# Patient Record
Sex: Female | Born: 1955
Health system: Southern US, Community
[De-identification: ages and names within clinical notes are randomized; demographics above are authoritative.]

## PROBLEM LIST (undated history)

## (undated) DIAGNOSIS — I10 Essential (primary) hypertension: Secondary | ICD-10-CM

## (undated) DIAGNOSIS — F988 Other specified behavioral and emotional disorders with onset usually occurring in childhood and adolescence: Secondary | ICD-10-CM

## (undated) HISTORY — PX: TONSILLECTOMY: SUR1361

## (undated) HISTORY — PX: ABDOMINAL HYSTERECTOMY: SHX81

## (undated) HISTORY — PX: WISDOM TOOTH EXTRACTION: SHX21

---

## 2007-09-14 HISTORY — PX: OTHER SURGICAL HISTORY: SHX169

## 2008-04-28 ENCOUNTER — Emergency Department (HOSPITAL_BASED_OUTPATIENT_CLINIC_OR_DEPARTMENT_OTHER): Admission: EM | Admit: 2008-04-28 | Discharge: 2008-04-28 | Payer: Self-pay | Admitting: Emergency Medicine

## 2008-04-29 ENCOUNTER — Ambulatory Visit (HOSPITAL_BASED_OUTPATIENT_CLINIC_OR_DEPARTMENT_OTHER): Admission: RE | Admit: 2008-04-29 | Discharge: 2008-04-29 | Payer: Self-pay | Admitting: *Deleted

## 2009-09-13 HISTORY — PX: LEG SURGERY: SHX1003

## 2010-03-31 ENCOUNTER — Emergency Department (HOSPITAL_BASED_OUTPATIENT_CLINIC_OR_DEPARTMENT_OTHER): Admission: EM | Admit: 2010-03-31 | Discharge: 2010-03-31 | Payer: Self-pay | Admitting: Emergency Medicine

## 2010-03-31 ENCOUNTER — Ambulatory Visit: Payer: Self-pay | Admitting: Diagnostic Radiology

## 2010-05-28 ENCOUNTER — Ambulatory Visit: Payer: Self-pay | Admitting: Diagnostic Radiology

## 2010-05-28 ENCOUNTER — Emergency Department (HOSPITAL_BASED_OUTPATIENT_CLINIC_OR_DEPARTMENT_OTHER): Admission: EM | Admit: 2010-05-28 | Discharge: 2010-05-28 | Payer: Self-pay | Admitting: Emergency Medicine

## 2010-05-29 ENCOUNTER — Inpatient Hospital Stay (HOSPITAL_COMMUNITY): Admission: AD | Admit: 2010-05-29 | Discharge: 2010-05-30 | Payer: Self-pay | Admitting: Orthopaedic Surgery

## 2010-09-24 IMAGING — CR DG TIBIA/FIBULA 2V*L*
2 series · 2 of 2 positions shown · non-contrast
Comparison: None

CLINICAL DATA: Left ankle and left lower leg pain status post fall

LEFT TIBIA AND FIBULA - 2 VIEW

[view not recorded (1 of 2)]
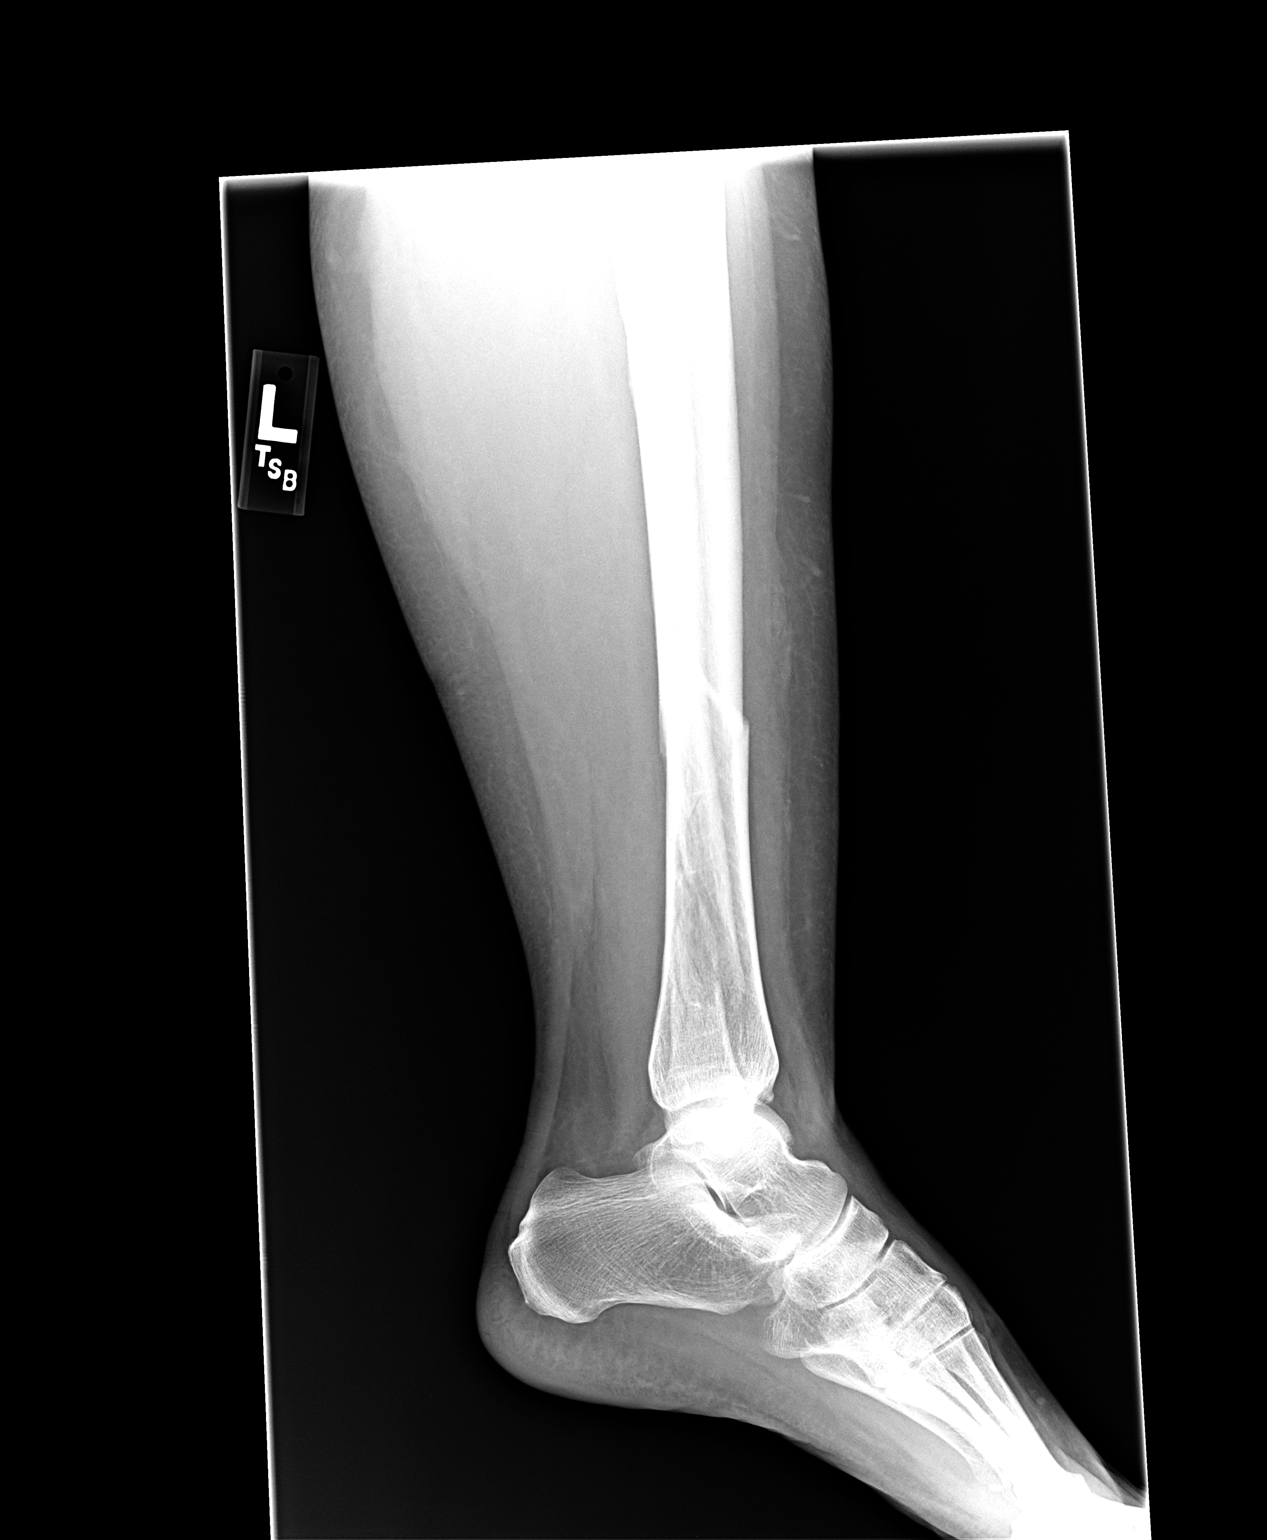

[view not recorded (2 of 2)]
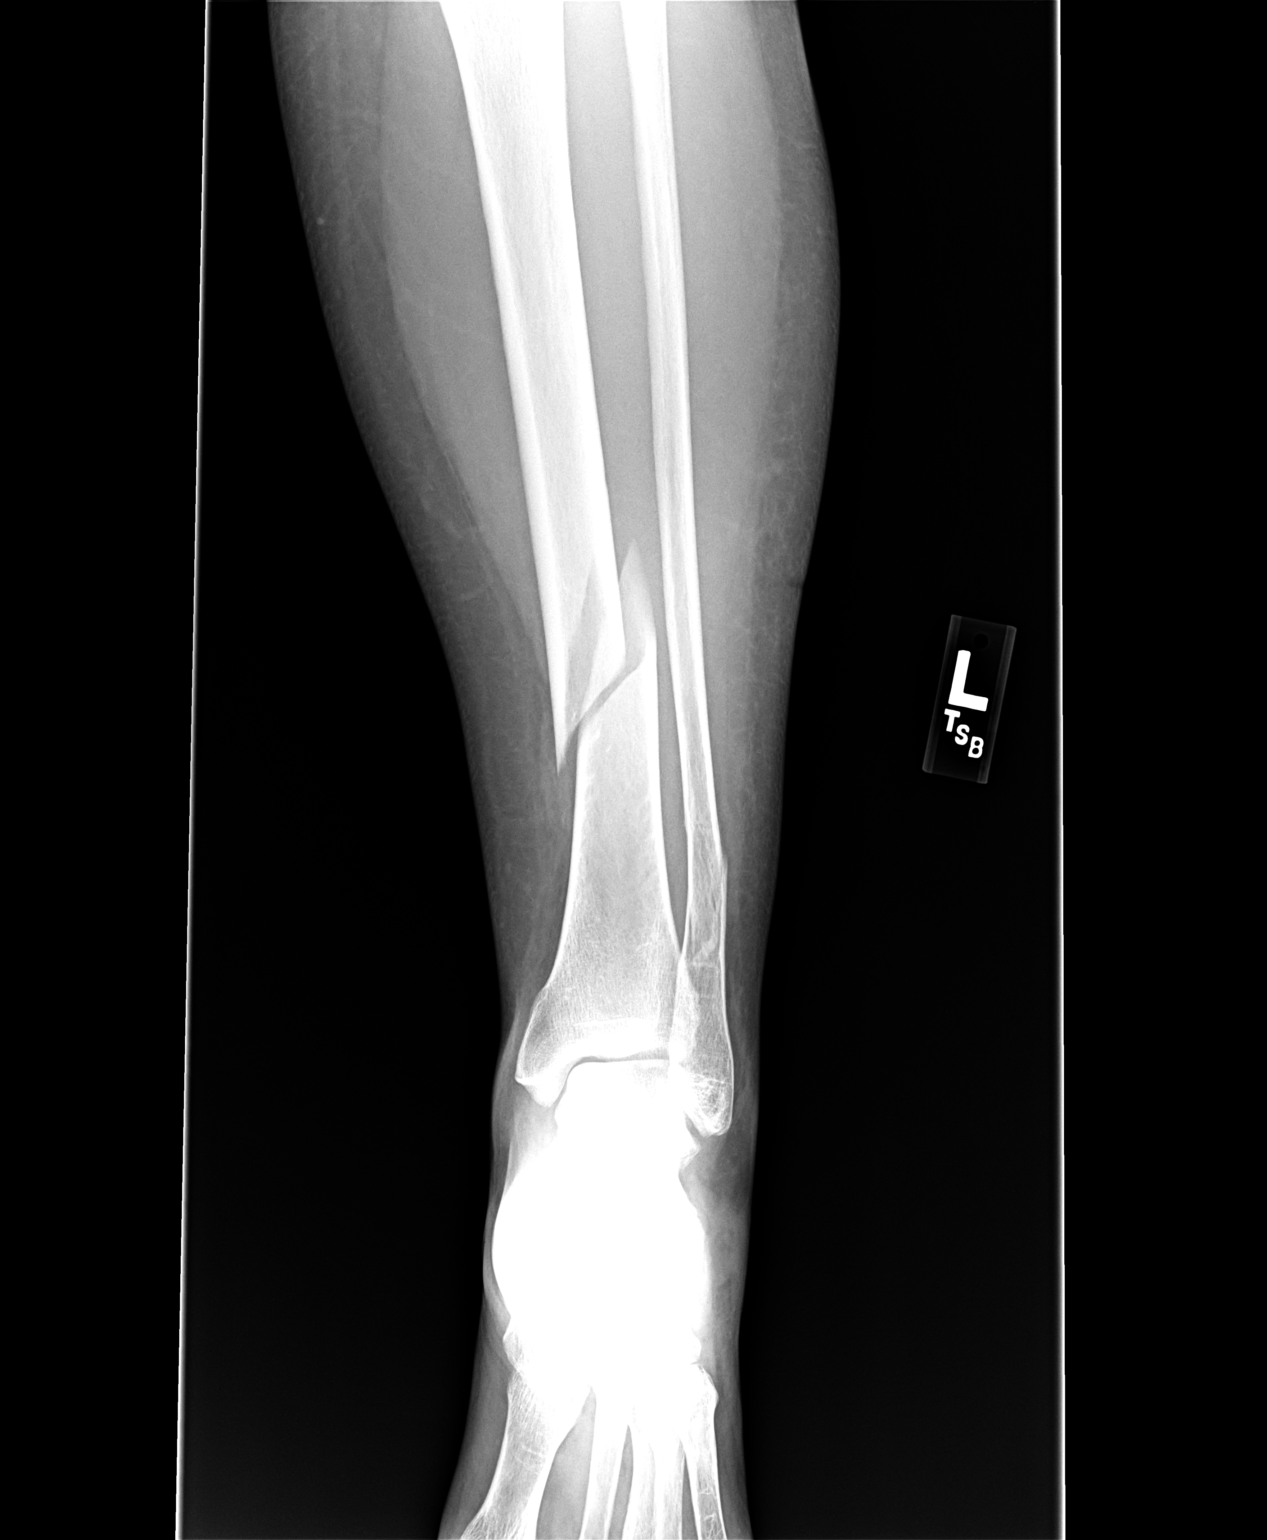

[2 of 2 positions shown; findings below may reference images not displayed]

FINDINGS: Oblique diaphyseal fracture mid to distal left tibia, minimally
displaced anteriorly and laterally.
Additional oblique distal left fibular diaphyseal fracture,
displaced posteriorly with minimal apex lateral angulation.
Associated soft tissue swelling.
Osseous demineralization.
Knee and ankle joint alignments normal.
No additional fracture or dislocation.
IMPRESSION: Left tibial and fibular diaphyseal fractures as above.

## 2010-11-26 LAB — CBC
MCHC: 35.6 g/dL (ref 30.0–36.0)
MCV: 94.5 fL (ref 78.0–100.0)
RBC: 4.12 MIL/uL (ref 3.87–5.11)

## 2010-11-26 LAB — BASIC METABOLIC PANEL
CO2: 27 mEq/L (ref 19–32)
Chloride: 105 mEq/L (ref 96–112)
GFR calc Af Amer: >60 mL/min (ref >60)
GFR calc non Af Amer: >60 mL/min (ref >60)
Glucose, Bld: 115 mg/dL — ABNORMAL HIGH (ref 70–99)
Potassium: 4.1 mEq/L (ref 3.5–5.1)

## 2010-11-26 LAB — SURGICAL PCR SCREEN: Staphylococcus aureus: NEGATIVE

## 2010-11-26 LAB — DIFFERENTIAL
Basophils Absolute: 0 10*3/uL (ref 0.0–0.1)
Basophils Relative: 1 % (ref 0–1)
Eosinophils Absolute: 0.3 10*3/uL (ref 0.0–0.7)
Lymphs Abs: 1.7 10*3/uL (ref 0.7–4.0)

## 2010-11-26 LAB — PROTIME-INR: Prothrombin Time: 12.8 seconds (ref 11.6–15.2)

## 2010-11-26 LAB — APTT: aPTT: 24 seconds (ref 24–37)

## 2010-11-28 LAB — DIFFERENTIAL
Basophils Absolute: 0.2 10*3/uL — ABNORMAL HIGH (ref 0.0–0.1)
Basophils Relative: 2 % — ABNORMAL HIGH (ref 0–1)
Eosinophils Absolute: 0.1 10*3/uL (ref 0.0–0.7)
Eosinophils Relative: 2 % (ref 0–5)
Lymphs Abs: 2.1 10*3/uL (ref 0.7–4.0)
Neutro Abs: 5.8 10*3/uL (ref 1.7–7.7)

## 2010-11-28 LAB — CBC
MCHC: 33.9 g/dL (ref 30.0–36.0)
MCV: 92.4 fL (ref 78.0–100.0)
Platelets: 183 10*3/uL (ref 150–400)
RBC: 4.52 MIL/uL (ref 3.87–5.11)
WBC: 8.6 10*3/uL (ref 4.0–10.5)

## 2010-11-28 LAB — POCT CARDIAC MARKERS
CKMB, poc: 1.3 ng/mL (ref 1.0–8.0)
Myoglobin, poc: 43.6 ng/mL (ref 12–200)
Troponin i, poc: <0.05 ng/mL (ref 0.00–0.09)

## 2010-11-28 LAB — BASIC METABOLIC PANEL
BUN: 11 mg/dL (ref 6–23)
Calcium: 9.8 mg/dL (ref 8.4–10.5)
Chloride: 100 mEq/L (ref 96–112)
GFR calc Af Amer: >60 mL/min (ref >60)
GFR calc non Af Amer: >60 mL/min (ref >60)
Potassium: 4.1 mEq/L (ref 3.5–5.1)
Sodium: 137 mEq/L (ref 135–145)

## 2011-01-26 NOTE — Op Note (Signed)
Chelsea Lang, Chelsea Lang NO.:  [id number]   MEDICAL RECORD NO.:  [id number]          PATIENT TYPE:  AMB   LOCATION:  DSC                          FACILITY:  [facility]   PHYSICIAN:  Tennis Must Meyerdierks, M.D.DATE OF BIRTH:  07-07-1956   DATE OF PROCEDURE:  04/29/2008  DATE OF DISCHARGE:                               OPERATIVE REPORT   PREOPERATIVE DIAGNOSIS:  Fracture, left distal radius.   POSTOPERATIVE DIAGNOSES:  Fracture, left distal radius.   PROCEDURE:  Open reduction and internal fixation, left distal radius  fracture.   SURGEON:  Lowell Bouton, MD   ANESTHESIA:  Axillary block.   OPERATIVE FINDINGS:  The patient had a comminuted fracture of the distal  radius with a tip of the ulnar styloid fracture.   PROCEDURE:  Under axillary block anesthesia with a tourniquet on the  left arm, the left hand was prepped and draped in usual fashion.  After  exsanguinating the limb, the tourniquet was inflated to 250 mmHg.  A  longitudinal incision was made overlying the volar aspect of the FCR  tendon and carried through the subcutaneous tissues.  Bleeding points  were coagulated.  Sharp dissection was carried down to the FCR tendon  sheath, which was released with the scissors.  The tendon was then  retracted ulnarly and the floor of the sheath was incised with the  scissors.  Blunt dissection was carried down to the FPL tendon, which  was retracted ulnarly.  The Weitlaner retractor was inserted and the  pronator quadratus was identified.  It was released from its radial  attachment to the radius.  It was elevated with a Therapist, nutritional.  The  fracture site was then identified and was freed up using a Market researcher.  It was reduced and temporarily pinned with a 45 K-wire  through the radial styloid.  X-rays showed good position of the fracture  and a DVR plate was applied using a short plate.  This was placed on the  volar aspect of the distal radius and  was applied with a 3.5-mm screw in  the slotted hole.  Two smooth pegs were then inserted distally and the  temporary K-wire was removed.  X-rays showed good position.  Four more  pegs were inserted distally using smooth pegs.  The remaining 3.5-mm  screws were then inserted in the radius.  The wound was then copiously  irrigated.  Final x-rays were obtained and there did not appear to be  any penetration of the joint.  TLS drain was inserted for drainage.  The  pronator quadratus was repaired with 4-0 Vicryl.  Subcutaneous tissue  was closed with 4-0 Vicryl and the skin with a 3-0 subcuticular Prolene.  Steri-Strips were applied followed by sterile dressings and a volar  wrist splint.  The tourniquet was released with good circulation of the  hand.  The patient went to recovery room, awake and stable in good  condition.      Lowell Bouton, M.D.  Electronically Signed    EMM/MEDQ  D:  04/29/2008  T:  04/30/2008  Job:  [id number]

## 2013-10-05 ENCOUNTER — Emergency Department (HOSPITAL_BASED_OUTPATIENT_CLINIC_OR_DEPARTMENT_OTHER): Payer: 59

## 2013-10-05 ENCOUNTER — Emergency Department (HOSPITAL_BASED_OUTPATIENT_CLINIC_OR_DEPARTMENT_OTHER)
Admission: EM | Admit: 2013-10-05 | Discharge: 2013-10-05 | Disposition: A | Discharge status: Home / Self Care | Payer: 59 | Attending: Emergency Medicine | Admitting: Emergency Medicine

## 2013-10-05 ENCOUNTER — Encounter (HOSPITAL_BASED_OUTPATIENT_CLINIC_OR_DEPARTMENT_OTHER): Payer: Self-pay | Admitting: Emergency Medicine

## 2013-10-05 DIAGNOSIS — M79609 Pain in unspecified limb: Secondary | ICD-10-CM | POA: Insufficient documentation

## 2013-10-05 DIAGNOSIS — F17200 Nicotine dependence, unspecified, uncomplicated: Secondary | ICD-10-CM | POA: Insufficient documentation

## 2013-10-05 DIAGNOSIS — F988 Other specified behavioral and emotional disorders with onset usually occurring in childhood and adolescence: Secondary | ICD-10-CM | POA: Insufficient documentation

## 2013-10-05 DIAGNOSIS — Z79899 Other long term (current) drug therapy: Secondary | ICD-10-CM | POA: Insufficient documentation

## 2013-10-05 DIAGNOSIS — M79601 Pain in right arm: Secondary | ICD-10-CM

## 2013-10-05 DIAGNOSIS — I10 Essential (primary) hypertension: Secondary | ICD-10-CM | POA: Insufficient documentation

## 2013-10-05 HISTORY — DX: Essential (primary) hypertension: I10

## 2013-10-05 HISTORY — DX: Other specified behavioral and emotional disorders with onset usually occurring in childhood and adolescence: F98.8

## 2013-10-05 LAB — D-DIMER, QUANTITATIVE (NOT AT ARMC): D DIMER QUANT: 0.33 ug{FEU}/mL (ref 0.00–0.48)

## 2013-10-05 MED ORDER — HYDROCODONE-ACETAMINOPHEN 5-325 MG PO TABS: 1.0000 | ORAL_TABLET | Freq: Once | ORAL | Status: DC

## 2013-10-05 MED ORDER — HYDROCODONE-ACETAMINOPHEN 5-325 MG PO TABS: 1.0000 | ORAL_TABLET | Freq: Four times a day (QID) | ORAL | Status: DC | PRN

## 2013-10-05 MED ORDER — KETOROLAC TROMETHAMINE 30 MG/ML IJ SOLN
30.0000 mg | Freq: Once | INTRAMUSCULAR | Status: AC
  Administered 2013-10-05: 30 mg via INTRAMUSCULAR
  Filled 2013-10-05: qty 1

## 2013-10-05 NOTE — ED Notes (Signed)
Right arm pain for over one month.  No known injury.  Was sick with a cough for several weeks when the pain started.  States pain is in the triceps area of the right arm with intermittent radiation of pain into forearm and shoulder.

## 2013-10-05 NOTE — ED Provider Notes (Addendum)
CSN: [id number]     Arrival date & time 10/05/13  4098 History   First MD Initiated Contact with Patient 10/05/13 [phone]     Chief Complaint  Patient presents with  . Arm Pain   (Consider location/radiation/quality/duration/timing/severity/associated sxs/prior Treatment) HPI  This is a 58 year old female who presents with right arm pain. Patient has a recent history of several weeks of viral type symptoms. She's been afebrile. She states since Christmas she has had progressively worsening right arm pain. The pain originates over her deltoid and sometimes goes into her shoulder and down her arm. She reports it as an achy pain. It is worse with some movements. Currently pain is "unbearable." She reports that the pain has been constant with waxing and waning intensity. No association with exertion. She took a Microbiologist prior to coming. She denies any history of blood clots. She denies any injury.  She denies any chest pain or shortness of breath.  Past Medical History  Diagnosis Date  . Hypertension   . ADD (attention deficit disorder)    Past Surgical History  Procedure Laterality Date  . Abdominal hysterectomy     No family history on file. History  Substance Use Topics  . Smoking status: Current Every Day Smoker    Types: Cigarettes  . Smokeless tobacco: Never Used  . Alcohol Use: No   OB History   Grav Para Term Preterm Abortions TAB SAB Ect Mult Living                 Review of Systems  Constitutional: Negative for fever.  Respiratory: Negative for cough, chest tightness and shortness of breath.   Cardiovascular: Negative for chest pain.  Gastrointestinal: Negative for abdominal pain.  Musculoskeletal:       Right arm pain  Skin: Negative for wound.  All other systems reviewed and are negative.    Allergies  Review of patient's allergies indicates no known allergies.  Home Medications   Current Outpatient Rx  Name  Route  Sig  Dispense  Refill  .  amphetamine-dextroamphetamine (ADDERALL XR) 30 MG 24 hr capsule   Oral   Take 30 mg by mouth daily.         Marland Kitchen lisinopril (PRINIVIL,ZESTRIL) 20 MG tablet   Oral   Take 20 mg by mouth daily.         Marland Kitchen HYDROcodone-acetaminophen (NORCO/VICODIN) 5-325 MG per tablet   Oral   Take 1 tablet by mouth every 6 (six) hours as needed for moderate pain.   15 tablet   0    Pulse 97  Temp(Src) 97.9 F (36.6 C) (Oral)  Resp 18  Ht 5\' 3"  (1.6 m)  Wt 150 lb (68.04 kg)  BMI 26.58 kg/m2  SpO2 99% Physical Exam  Nursing note and vitals reviewed. Constitutional: She is oriented to person, place, and time. She appears well-developed and well-nourished.  HENT:  Head: Normocephalic and atraumatic.  Cardiovascular: Normal rate, regular rhythm and normal heart sounds.   No murmur heard. Pulmonary/Chest: Effort normal and breath sounds normal. No respiratory distress. She has no wheezes.  Abdominal: Soft. There is no tenderness.  Musculoskeletal: Normal range of motion. She exhibits no edema.  Normal range of motion of the right upper tremor the, 5 out of 5 strength in the right upper extremity, tenderness palpation over the deltoid was obvious deformity, no significant swelling noted  Neurological: She is alert and oriented to person, place, and time.  Skin: Skin is warm and dry.  Psychiatric: She has a normal mood and affect.    ED Course  Procedures (including critical care time) Labs Review Labs Reviewed  D-DIMER, QUANTITATIVE   Imaging Review Dg Shoulder Right  10/05/2013   CLINICAL DATA:  Posterior right shoulder and humerus pain radiating into the right arm  EXAM: RIGHT SHOULDER - 2+ VIEW  COMPARISON:  None.  FINDINGS: No fracture or dislocation. Glenohumeral and acromioclavicular joint spaces appear preserved given obliquity. No definite evidence of calcific tendinitis. Regional soft tissues are normal. Limited visualization adjacent thorax is normal.  IMPRESSION: No explanation for  patient's right-sided shoulder pain.   Electronically Signed   By: Simonne ComeJohn  Watts M.D.   On: 10/05/2013 08:28    EKG Interpretation   None      EKG independently reviewed by myself: Normal sinus rhythm with a rate of 78, no evidence of acute ST elevation or ischemia  MDM   1. Arm pain, right    Patient presents with right arm pain for 1 month. Pain appears musculoskeletal and she has tenderness to palpation and worsening of pain with range of motion of the right arm. She has good strength and is neurovascularly intact. No known injury. D-dimer is negative to screen for blood clots. X-rays are negative for acute fracture. Screening EKG was obtained and shows no evidence of acute ischemia. Have low suspicion at this time for ACS equivalent given the persistence of pain over the last month and the tenderness on exam. Patient was given Norco and Toradol. She will be given a short course of pain medication as an outpatient. Suspect musculoskeletal etiology. She's to followup with her primary doctor in one week if symptoms persist. She was given strict return precautions.  After history, exam, and medical workup I feel the patient has been appropriately medically screened and is safe for discharge home. Pertinent diagnoses were discussed with the patient. Patient was given return precautions.     Shon Batonourtney F Horton, MD 10/05/13 0900  Patient requesting referral to orthopedics. Patient was given card for [facility] for follow-up.  Shon Batonourtney F Horton, MD 10/05/13 [phone]

## 2013-10-05 NOTE — Discharge Instructions (Signed)
You were seen today for right arm pain. Workup is negative for blood clot, fracture. At this time it is unclear why her arm was hurting. He should followup with her primary doctor in one week if symptoms persist. He'll be given a short course of pain medication. He should return if he has any new or worsening symptoms including chest pain or shortness of breath, fevers or any other concerning symptoms.  The patient has been given a narcotic pain medication at discharge. He/she was instructed to avoid operating heavy machinery or drive while taking this.

## 2014-05-28 ENCOUNTER — Ambulatory Visit (INDEPENDENT_AMBULATORY_CARE_PROVIDER_SITE_OTHER): Payer: 59 | Admitting: Physician Assistant

## 2014-05-28 ENCOUNTER — Encounter: Payer: Self-pay | Admitting: Physician Assistant

## 2014-05-28 VITALS — BP 115/82 | HR 90 | Temp 98.6°F | Resp 16 | Ht 63.0 in | Wt 168.1 lb

## 2014-05-28 DIAGNOSIS — B9689 Other specified bacterial agents as the cause of diseases classified elsewhere: Secondary | ICD-10-CM

## 2014-05-28 DIAGNOSIS — F988 Other specified behavioral and emotional disorders with onset usually occurring in childhood and adolescence: Secondary | ICD-10-CM

## 2014-05-28 DIAGNOSIS — I10 Essential (primary) hypertension: Secondary | ICD-10-CM

## 2014-05-28 DIAGNOSIS — B002 Herpesviral gingivostomatitis and pharyngotonsillitis: Secondary | ICD-10-CM | POA: Insufficient documentation

## 2014-05-28 DIAGNOSIS — J019 Acute sinusitis, unspecified: Secondary | ICD-10-CM

## 2014-05-28 MED ORDER — VALACYCLOVIR HCL 1 G PO TABS: 2000.0000 mg | ORAL_TABLET | Freq: Two times a day (BID) | ORAL | Status: DC

## 2014-05-28 MED ORDER — AZITHROMYCIN 250 MG PO TABS: ORAL_TABLET | ORAL | Status: DC

## 2014-05-28 MED ORDER — BENZONATATE 200 MG PO CAPS: 200.0000 mg | ORAL_CAPSULE | Freq: Three times a day (TID) | ORAL | Status: DC | PRN

## 2014-05-28 MED ORDER — LISINOPRIL 20 MG PO TABS: 20.0000 mg | ORAL_TABLET | Freq: Every day | ORAL | Status: DC

## 2014-05-28 NOTE — Assessment & Plan Note (Signed)
Not responding to Augmentin.  STOP medication.  Rx Azithromycin. Increase fluids.  Rest.  Saline nasal spray. Probiotic.  Rx Tessalon for cough.  Humidifier in bedroom.  Return precautions discussed with patient.

## 2014-05-28 NOTE — Progress Notes (Signed)
Pre visit review using our clinic review tool, if applicable. No additional management support is needed unless otherwise documented below in the visit note/SLS  

## 2014-05-28 NOTE — Assessment & Plan Note (Signed)
Rx Valtrex 2000 mg BID x 1 day.  Topical Abreva.

## 2014-05-28 NOTE — Progress Notes (Signed)
Patient presents to clinic today to establish care.  Acute Concerns: Patient c/o sinus pressure, sinus pain, ear pain and productive cough x 2 weeks. Patient denies fever, chills, aches.  Was seen at Urgent Care on 05/23/14 and given Rx for Augmentin to take twice daily.  Endorses taking as directed without improvement.    Chronic Issues: Hypertension -- Endorses well-controlled with current regimen of lisinopril 20 mg daily. Denies chronic cough.  Denies chest pain, palpitations, headaches, lightheadedness, headaches or vision changes  ADD -- Endorses well-controlled with Adderall XR 30 daily.  Is followed by Psychiatry, Dr. Quentin Mulling who prescribes this.  Health Maintenance: Dental -- up-to-date Vision -- up-to-date Immunizations -- Will be getting flu shot at work on 06/13/2014.  Declines Penumovax. Unsure Tetanus but feels it has been within the past 6-7 years. Colonoscopy -- Overdue for colonoscopy.  Declines at present. Mammogram -- Up-to-date. 2014.  Gets yearly.  No abnormal mammograms. PAP -- Up-to-date. Followed by [facility].  Past Medical History  Diagnosis Date  . Hypertension   . ADD (attention deficit disorder)     Past Surgical History  Procedure Laterality Date  . Abdominal hysterectomy    . Arm surgery  2009    Broken  . Leg surgery  2011    Broken  . Wisdom tooth extraction    . Tonsillectomy      Current Outpatient Prescriptions on File Prior to Visit  Medication Sig Dispense Refill  . amphetamine-dextroamphetamine (ADDERALL XR) 30 MG 24 hr capsule Take 30 mg by mouth daily.       No current facility-administered medications on file prior to visit.    No Known Allergies  Family History  Problem Relation Age of Onset  . Alcoholism Mother 77    Deceased  . Other Father     Deceased- 69-80s  . Stroke Maternal Grandmother   . Dementia Paternal Aunt   . Healthy Son     x1    History   Social History  . Marital Status: Married    Spouse  Name: N/A    Number of Children: N/A  . Years of Education: N/A   Occupational History  . Not on file.   Social History Main Topics  . Smoking status: Current Some Day Smoker -- 0.25 packs/day    Types: Cigarettes  . Smokeless tobacco: Never Used  . Alcohol Use: No  . Drug Use: No  . Sexual Activity: Not on file   Other Topics Concern  . Not on file   Social History Narrative  . No narrative on file   ROS See HPI.  All other ROS are negative.  BP 115/82  Pulse 90  Temp(Src) 98.6 F (37 C) (Oral)  Resp 16  Ht  (1.6 m)  Wt 168 lb 2 oz (76.261 kg)  BMI 29.79 kg/m2  SpO2 95%  Physical Exam  Vitals reviewed. Constitutional: She is well-developed, well-nourished, and in no distress.  HENT:  Head: Normocephalic and atraumatic.  Right Ear: Tympanic membrane, external ear and ear canal normal.  Left Ear: Tympanic membrane, external ear and ear canal normal.  Nose: Mucosal edema present. No rhinorrhea. Right sinus exhibits no maxillary sinus tenderness and no frontal sinus tenderness. Left sinus exhibits maxillary sinus tenderness and frontal sinus tenderness.  Mouth/Throat: Uvula is midline, oropharynx is clear and moist and mucous membranes are normal.  Eyes: Conjunctivae are normal. Pupils are equal, round, and reactive to light.  Neck: Neck supple.  Cardiovascular: Normal  rate, regular rhythm, normal heart sounds and intact distal pulses.   Pulmonary/Chest: Effort normal and breath sounds normal. No respiratory distress. She has no wheezes. She has no rales.  Lymphadenopathy:    She has no cervical adenopathy.  Skin: Skin is warm and dry.  Psychiatric: Affect normal.   Assessment/Plan: Essential hypertension, benign BP well-controlled with current regimen.  Medications refilled.   Acute bacterial sinusitis Not responding to Augmentin.  STOP medication.  Rx Azithromycin. Increase fluids.  Rest.  Saline nasal spray. Probiotic.  Rx Tessalon for cough.  Humidifier  in bedroom.  Return precautions discussed with patient.  Oral herpes simplex infection Rx Valtrex 2000 mg BID x 1 day.  Topical Abreva.   ADD (attention deficit disorder) Doing well.  Continue current regimen.  Follow-up with specialist as scheduled.

## 2014-05-28 NOTE — Assessment & Plan Note (Signed)
BP well-controlled with current regimen.  Medications refilled.

## 2014-05-28 NOTE — Assessment & Plan Note (Signed)
Doing well.  Continue current regimen.  Follow-up with specialist as scheduled.

## 2014-05-28 NOTE — Patient Instructions (Signed)
STOP Augmentin. Please take new antibiotic(Azithromycin) as directed.  Increase fluid intake.  Use Saline nasal spray.  Take a daily multivitamin. Use Tessalon Perles for cough.  Place a humidifier in the bedroom.  Please call or return clinic if symptoms are not improving.  Sinusitis Sinusitis is redness, soreness, and swelling (inflammation) of the paranasal sinuses. Paranasal sinuses are air pockets within the bones of your face (beneath the eyes, the middle of the forehead, or above the eyes). In healthy paranasal sinuses, mucus is able to drain out, and air is able to circulate through them by way of your nose. However, when your paranasal sinuses are inflamed, mucus and air can become trapped. This can allow bacteria and other germs to grow and cause infection. Sinusitis can develop quickly and last only a short time (acute) or continue over a long period (chronic). Sinusitis that lasts for more than 12 weeks is considered chronic.  CAUSES  Causes of sinusitis include:  Allergies.  Structural abnormalities, such as displacement of the cartilage that separates your nostrils (deviated septum), which can decrease the air flow through your nose and sinuses and affect sinus drainage.  Functional abnormalities, such as when the small hairs (cilia) that line your sinuses and help remove mucus do not work properly or are not present. SYMPTOMS  Symptoms of acute and chronic sinusitis are the same. The primary symptoms are pain and pressure around the affected sinuses. Other symptoms include:  Upper toothache.  Earache.  Headache.  Bad breath.  Decreased sense of smell and taste.  A cough, which worsens when you are lying flat.  Fatigue.  Fever.  Thick drainage from your nose, which often is green and may contain pus (purulent).  Swelling and warmth over the affected sinuses. DIAGNOSIS  Your caregiver will perform a physical exam. During the exam, your caregiver may:  Look in your  nose for signs of abnormal growths in your nostrils (nasal polyps).  Tap over the affected sinus to check for signs of infection.  View the inside of your sinuses (endoscopy) with a special imaging device with a light attached (endoscope), which is inserted into your sinuses. If your caregiver suspects that you have chronic sinusitis, one or more of the following tests may be recommended:  Allergy tests.  Nasal culture A sample of mucus is taken from your nose and sent to a lab and screened for bacteria.  Nasal cytology A sample of mucus is taken from your nose and examined by your caregiver to determine if your sinusitis is related to an allergy. TREATMENT  Most cases of acute sinusitis are related to a viral infection and will resolve on their own within 10 days. Sometimes medicines are prescribed to help relieve symptoms (pain medicine, decongestants, nasal steroid sprays, or saline sprays).  However, for sinusitis related to a bacterial infection, your caregiver will prescribe antibiotic medicines. These are medicines that will help kill the bacteria causing the infection.  Rarely, sinusitis is caused by a fungal infection. In theses cases, your caregiver will prescribe antifungal medicine. For some cases of chronic sinusitis, surgery is needed. Generally, these are cases in which sinusitis recurs more than 3 times per year, despite other treatments. HOME CARE INSTRUCTIONS   Drink plenty of water. Water helps thin the mucus so your sinuses can drain more easily.  Use a humidifier.  Inhale steam 3 to 4 times a day (for example, sit in the bathroom with the shower running).  Apply a warm, moist washcloth to  your face 3 to 4 times a day, or as directed by your caregiver.  Use saline nasal sprays to help moisten and clean your sinuses.  Take over-the-counter or prescription medicines for pain, discomfort, or fever only as directed by your caregiver. SEEK IMMEDIATE MEDICAL CARE  IF:  You have increasing pain or severe headaches.  You have nausea, vomiting, or drowsiness.  You have swelling around your face.  You have vision problems.  You have a stiff neck.  You have difficulty breathing. MAKE SURE YOU:   Understand these instructions.  Will watch your condition.  Will get help right away if you are not doing well or get worse. Document Released: 08/30/2005 Document Revised: 11/22/2011 Document Reviewed: 09/14/2011 [facility] Patient Information 2014 [place], [place].

## 2015-05-22 LAB — LIPID PANEL
Cholesterol: 209 mg/dL — AB (ref 0–200)
HDL: 68 mg/dL (ref 35–70)
LDL CALC: 128 mg/dL
Triglycerides: 71 mg/dL (ref 40–160)

## 2015-05-22 LAB — BASIC METABOLIC PANEL: Glucose: 96 mg/dL

## 2015-06-02 ENCOUNTER — Ambulatory Visit (INDEPENDENT_AMBULATORY_CARE_PROVIDER_SITE_OTHER): Payer: 59 | Admitting: Physician Assistant

## 2015-06-02 ENCOUNTER — Encounter: Payer: Self-pay | Admitting: Physician Assistant

## 2015-06-02 VITALS — BP 118/80 | HR 86 | Temp 98.2°F | Resp 16 | Ht 63.0 in | Wt 170.1 lb

## 2015-06-02 DIAGNOSIS — I10 Essential (primary) hypertension: Secondary | ICD-10-CM | POA: Diagnosis not present

## 2015-06-02 DIAGNOSIS — Z23 Encounter for immunization: Secondary | ICD-10-CM

## 2015-06-02 LAB — BASIC METABOLIC PANEL
BUN: 13 mg/dL (ref 6–23)
CHLORIDE: 102 meq/L (ref 96–112)
CO2: 30 mEq/L (ref 19–32)
CREATININE: 0.63 mg/dL (ref 0.40–1.20)
Calcium: 9.8 mg/dL (ref 8.4–10.5)
GFR: 103.59 mL/min (ref >60.00)
GLUCOSE: 111 mg/dL — AB (ref 70–99)
Potassium: 4.3 mEq/L (ref 3.5–5.1)
Sodium: 139 mEq/L (ref 135–145)

## 2015-06-02 MED ORDER — LISINOPRIL 20 MG PO TABS: 20.0000 mg | ORAL_TABLET | Freq: Every day | ORAL | Status: DC

## 2015-06-02 MED ORDER — VALACYCLOVIR HCL 1 G PO TABS: 2000.0000 mg | ORAL_TABLET | Freq: Two times a day (BID) | ORAL | Status: AC | PRN

## 2015-06-02 NOTE — Addendum Note (Signed)
Addended by: Regis Bill on: 06/02/2015 05:40 PM   Modules accepted: Orders

## 2015-06-02 NOTE — Progress Notes (Signed)
   Patient presents to clinic today for follow-up of hypertension, previously well-controlled with lisinopril 20 mg daily. Patient denies chest pain, palpitations, lightheadedness, dizziness, vision changes or frequent headaches. Denies chronic cough with ACEI.  BP Readings from Last 3 Encounters:  06/02/15 118/80  05/28/14 115/82  10/05/13 138/81    Past Medical History  Diagnosis Date  . Hypertension   . ADD (attention deficit disorder)     Current Outpatient Prescriptions on File Prior to Visit  Medication Sig Dispense Refill  . amphetamine-dextroamphetamine (ADDERALL XR) 30 MG 24 hr capsule Take 30 mg by mouth daily.     No current facility-administered medications on file prior to visit.    No Known Allergies  Family History  Problem Relation Age of Onset  . Alcoholism Mother 74    Deceased  . Other Father     Deceased- 70-80s  . Stroke Maternal Grandmother   . Dementia Paternal Aunt   . Healthy Son     x1    Social History   Social History  . Marital Status: Married    Spouse Name: N/A  . Number of Children: N/A  . Years of Education: N/A   Social History Main Topics  . Smoking status: Current Every Day Smoker -- 0.25 packs/day    Types: Cigarettes  . Smokeless tobacco: Never Used  . Alcohol Use: No  . Drug Use: No  . Sexual Activity: Not Asked   Other Topics Concern  . None   Social History Narrative   Review of Systems - See HPI.  All other ROS are negative.  BP 118/80 mmHg  Pulse 86  Temp(Src) 98.2 F (36.8 C) (Oral)  Resp 16  Ht  (1.6 m)  Wt 170 lb 2 oz (77.168 kg)  BMI 30.14 kg/m2  SpO2 98%  Physical Exam  Constitutional: She is oriented to person, place, and time and well-developed, well-nourished, and in no distress.  HENT:  Head: Normocephalic and atraumatic.  Cardiovascular: Normal rate, regular rhythm, normal heart sounds and intact distal pulses.   Pulmonary/Chest: Effort normal and breath sounds normal. No respiratory  distress. She has no wheezes. She has no rales. She exhibits no tenderness.  Neurological: She is alert and oriented to person, place, and time.  Skin: Skin is warm and dry. No rash noted.  Psychiatric: Affect normal.  Vitals reviewed.   No results found for this or any previous visit (from the past 2160 hour(s)).  Assessment/Plan: Essential hypertension, benign Stable. Will check BMP today. Continue current regimen. DASH diet reiterated. Follow-up to be determined by labs.

## 2015-06-02 NOTE — Assessment & Plan Note (Signed)
Stable. Will check BMP today. Continue current regimen. DASH diet reiterated. Follow-up to be determined by labs.

## 2015-06-02 NOTE — Patient Instructions (Signed)
Please go to the lab for blood work.  I will call you with your results.  Please continue your medications as directed. Follow-up will be based on your results.  Once your insurance changes, please reconsider a colonoscopy. Please follow-up with your Gynecologist as scheduled.  DASH Eating Plan DASH stands for "Dietary Approaches to Stop Hypertension." The DASH eating plan is a healthy eating plan that has been shown to reduce high blood pressure (hypertension). Additional health benefits may include reducing the risk of type 2 diabetes mellitus, heart disease, and stroke. The DASH eating plan may also help with weight loss. WHAT DO I NEED TO KNOW ABOUT THE DASH EATING PLAN? For the DASH eating plan, you will follow these general guidelines:  Choose foods with a percent daily value for sodium of less than 5% (as listed on the food label).  Use salt-free seasonings or herbs instead of table salt or sea salt.  Check with your health care provider or pharmacist before using salt substitutes.  Eat lower-sodium products, often labeled as "lower sodium" or "no salt added."  Eat fresh foods.  Eat more vegetables, fruits, and low-fat dairy products.  Choose whole grains. Look for the word "whole" as the first word in the ingredient list.  Choose fish and skinless chicken or [place] more often than red meat. Limit fish, poultry, and meat to 6 oz (170 g) each day.  Limit sweets, desserts, sugars, and sugary drinks.  Choose heart-healthy fats.  Limit cheese to 1 oz (28 g) per day.  Eat more home-cooked food and less restaurant, buffet, and fast food.  Limit fried foods.  Cook foods using methods other than frying.  Limit canned vegetables. If you do use them, rinse them well to decrease the sodium.  When eating at a restaurant, ask that your food be prepared with less salt, or no salt if possible. WHAT FOODS CAN I EAT? Seek help from a dietitian for individual calorie  needs. Grains Whole grain or whole wheat bread. Brown rice. Whole grain or whole wheat pasta. Quinoa, bulgur, and whole grain cereals. Low-sodium cereals. Corn or whole wheat flour tortillas. Whole grain cornbread. Whole grain crackers. Low-sodium crackers. Vegetables Fresh or frozen vegetables (raw, steamed, roasted, or grilled). Low-sodium or reduced-sodium tomato and vegetable juices. Low-sodium or reduced-sodium tomato sauce and paste. Low-sodium or reduced-sodium canned vegetables.  Fruits All fresh, canned (in natural juice), or frozen fruits. Meat and Other Protein Products Ground beef (85% or leaner), grass-fed beef, or beef trimmed of fat. Skinless chicken or [place]. Ground chicken or [place]. Pork trimmed of fat. All fish and seafood. Eggs. Dried beans, peas, or lentils. Unsalted nuts and seeds. Unsalted canned beans. Dairy Low-fat dairy products, such as skim or 1% milk, 2% or reduced-fat cheeses, low-fat ricotta or cottage cheese, or plain low-fat yogurt. Low-sodium or reduced-sodium cheeses. Fats and Oils Tub margarines without trans fats. Light or reduced-fat mayonnaise and salad dressings (reduced sodium). Avocado. Safflower, olive, or canola oils. Natural peanut or almond butter. Other Unsalted popcorn and pretzels. The items listed above may not be a complete list of recommended foods or beverages. Contact your dietitian for more options. WHAT FOODS ARE NOT RECOMMENDED? Grains White bread. White pasta. White rice. Refined cornbread. Bagels and croissants. Crackers that contain trans fat. Vegetables Creamed or fried vegetables. Vegetables in a cheese sauce. Regular canned vegetables. Regular canned tomato sauce and paste. Regular tomato and vegetable juices. Fruits Dried fruits. Canned fruit in light or heavy syrup. Fruit juice. Meat  and Other Protein Products Fatty cuts of meat. Ribs, chicken wings, bacon, sausage, bologna, salami, chitterlings, fatback, hot dogs, bratwurst,  and packaged luncheon meats. Salted nuts and seeds. Canned beans with salt. Dairy Whole or 2% milk, cream, half-and-half, and cream cheese. Whole-fat or sweetened yogurt. Full-fat cheeses or blue cheese. Nondairy creamers and whipped toppings. Processed cheese, cheese spreads, or cheese curds. Condiments Onion and garlic salt, seasoned salt, table salt, and sea salt. Canned and packaged gravies. Worcestershire sauce. Tartar sauce. Barbecue sauce. Teriyaki sauce. Soy sauce, including reduced sodium. Steak sauce. Fish sauce. Oyster sauce. Cocktail sauce. Horseradish. Ketchup and mustard. Meat flavorings and tenderizers. Bouillon cubes. Hot sauce. Tabasco sauce. Marinades. Taco seasonings. Relishes. Fats and Oils Butter, stick margarine, lard, shortening, ghee, and bacon fat. Coconut, palm kernel, or palm oils. Regular salad dressings. Other Pickles and olives. Salted popcorn and pretzels. The items listed above may not be a complete list of foods and beverages to avoid. Contact your dietitian for more information. WHERE CAN I FIND MORE INFORMATION? National Heart, Lung, and Blood Institute: [employer] Document Released: 08/19/2011 Document Revised: 01/14/2014 Document Reviewed: 07/04/2013 [facility] Patient Information 2015 [place], [place]. This information is not intended to replace advice given to you by your health care provider. Make sure you discuss any questions you have with your health care provider.

## 2015-06-02 NOTE — Progress Notes (Signed)
Pre visit review using our clinic review tool, if applicable. No additional management support is needed unless otherwise documented below in the visit note/SLS  

## 2015-06-03 ENCOUNTER — Encounter: Payer: Self-pay | Admitting: *Deleted

## 2015-10-02 MED FILL — LISINOPRIL 20 MG TABLET: 20 | 30 days supply | Qty: 30 | Fill #3

## 2015-10-08 ENCOUNTER — Telehealth: Payer: Self-pay | Admitting: Physician Assistant

## 2015-10-08 NOTE — Telephone Encounter (Signed)
Left message for patient to call about flu shot °

## 2015-10-29 ENCOUNTER — Encounter: Payer: Self-pay | Admitting: Physician Assistant

## 2015-10-29 ENCOUNTER — Ambulatory Visit (INDEPENDENT_AMBULATORY_CARE_PROVIDER_SITE_OTHER): Payer: 59 | Admitting: Physician Assistant

## 2015-10-29 VITALS — BP 140/80 | HR 78 | Temp 97.8°F | Ht 63.0 in | Wt 178.4 lb

## 2015-10-29 DIAGNOSIS — J019 Acute sinusitis, unspecified: Secondary | ICD-10-CM | POA: Diagnosis not present

## 2015-10-29 DIAGNOSIS — B9689 Other specified bacterial agents as the cause of diseases classified elsewhere: Secondary | ICD-10-CM

## 2015-10-29 MED ORDER — AMOXICILLIN-POT CLAVULANATE 875-125 MG PO TABS: 1.0000 | ORAL_TABLET | Freq: Two times a day (BID) | ORAL | Status: DC

## 2015-10-29 MED FILL — LISINOPRIL 20 MG TABLET: 20 | 30 days supply | Qty: 30 | Fill #4

## 2015-10-29 MED FILL — AMOX-CLAV 875-125 MG TABLET: 875-125 | 7 days supply | Qty: 14 | Fill #0

## 2015-10-29 NOTE — Assessment & Plan Note (Signed)
Rx Augmetin.  Increase fluids.  Rest.  Saline nasal spray.  Probiotic.  Mucinex as directed.  Humidifier in bedroom. Delsym for cough.  Call or return to clinic if symptoms are not improving.

## 2015-10-29 NOTE — Progress Notes (Signed)
Pre visit review using our clinic review tool, if applicable. No additional management support is needed unless otherwise documented below in the visit note. 

## 2015-10-29 NOTE — Patient Instructions (Signed)
Please take antibiotic as directed.  Increase fluid intake.  Use Saline nasal spray.  Take a daily multivitamin. Continue saline spray. OTC Delsym for cough  Place a humidifier in the bedroom.  Please call or return clinic if symptoms are not improving.  Sinusitis Sinusitis is redness, soreness, and swelling (inflammation) of the paranasal sinuses. Paranasal sinuses are air pockets within the bones of your face (beneath the eyes, the middle of the forehead, or above the eyes). In healthy paranasal sinuses, mucus is able to drain out, and air is able to circulate through them by way of your nose. However, when your paranasal sinuses are inflamed, mucus and air can become trapped. This can allow bacteria and other germs to grow and cause infection. Sinusitis can develop quickly and last only a short time (acute) or continue over a long period (chronic). Sinusitis that lasts for more than 12 weeks is considered chronic.  CAUSES  Causes of sinusitis include:  Allergies.  Structural abnormalities, such as displacement of the cartilage that separates your nostrils (deviated septum), which can decrease the air flow through your nose and sinuses and affect sinus drainage.  Functional abnormalities, such as when the small hairs (cilia) that line your sinuses and help remove mucus do not work properly or are not present. SYMPTOMS  Symptoms of acute and chronic sinusitis are the same. The primary symptoms are pain and pressure around the affected sinuses. Other symptoms include:  Upper toothache.  Earache.  Headache.  Bad breath.  Decreased sense of smell and taste.  A cough, which worsens when you are lying flat.  Fatigue.  Fever.  Thick drainage from your nose, which often is green and may contain pus (purulent).  Swelling and warmth over the affected sinuses. DIAGNOSIS  Your caregiver will perform a physical exam. During the exam, your caregiver may:  Look in your nose for signs of  abnormal growths in your nostrils (nasal polyps).  Tap over the affected sinus to check for signs of infection.  View the inside of your sinuses (endoscopy) with a special imaging device with a light attached (endoscope), which is inserted into your sinuses. If your caregiver suspects that you have chronic sinusitis, one or more of the following tests may be recommended:  Allergy tests.  Nasal culture A sample of mucus is taken from your nose and sent to a lab and screened for bacteria.  Nasal cytology A sample of mucus is taken from your nose and examined by your caregiver to determine if your sinusitis is related to an allergy. TREATMENT  Most cases of acute sinusitis are related to a viral infection and will resolve on their own within 10 days. Sometimes medicines are prescribed to help relieve symptoms (pain medicine, decongestants, nasal steroid sprays, or saline sprays).  However, for sinusitis related to a bacterial infection, your caregiver will prescribe antibiotic medicines. These are medicines that will help kill the bacteria causing the infection.  Rarely, sinusitis is caused by a fungal infection. In theses cases, your caregiver will prescribe antifungal medicine. For some cases of chronic sinusitis, surgery is needed. Generally, these are cases in which sinusitis recurs more than 3 times per year, despite other treatments. HOME CARE INSTRUCTIONS   Drink plenty of water. Water helps thin the mucus so your sinuses can drain more easily.  Use a humidifier.  Inhale steam 3 to 4 times a day (for example, sit in the bathroom with the shower running).  Apply a warm, moist washcloth to your  face 3 to 4 times a day, or as directed by your caregiver.  Use saline nasal sprays to help moisten and clean your sinuses.  Take over-the-counter or prescription medicines for pain, discomfort, or fever only as directed by your caregiver. SEEK IMMEDIATE MEDICAL CARE IF:  You have increasing  pain or severe headaches.  You have nausea, vomiting, or drowsiness.  You have swelling around your face.  You have vision problems.  You have a stiff neck.  You have difficulty breathing. MAKE SURE YOU:   Understand these instructions.  Will watch your condition.  Will get help right away if you are not doing well or get worse. Document Released: 08/30/2005 Document Revised: 11/22/2011 Document Reviewed: 09/14/2011 [facility] Patient Information 2014 [place], [place].

## 2015-10-29 NOTE — Progress Notes (Signed)
   Patient presents to clinic today c/o 1.5 weeks of nasal congestion with PND and pressure associated with dry irritating cough. Denies facial pain. Endorses ear pressure  And pain. Has felt feverish but has not taken temperature. Denies recent travel or sick contact. Has used a Neti pot with some relief in symptoms. Some tylenol use.   Past Medical History  Diagnosis Date  . Hypertension   . ADD (attention deficit disorder)     Current Outpatient Prescriptions on File Prior to Visit  Medication Sig Dispense Refill  . amphetamine-dextroamphetamine (ADDERALL XR) 30 MG 24 hr capsule Take 4 tablets by mouth daily as needed.    Marland Kitchen lisinopril (PRINIVIL,ZESTRIL) 20 MG tablet Take 1 tablet (20 mg total) by mouth daily. 90 tablet 1  . valACYclovir (VALTREX) 1000 MG tablet Take 2 tablets (2,000 mg total) by mouth 2 (two) times daily as needed. For 1 day. 20 tablet 0   No current facility-administered medications on file prior to visit.    No Known Allergies  Family History  Problem Relation Age of Onset  . Alcoholism Mother 57    Deceased  . Other Father     Deceased- 57-80s  . Stroke Maternal Grandmother   . Dementia Paternal Aunt   . Healthy Son     x1    Social History   Social History  . Marital Status: Married    Spouse Name: N/A  . Number of Children: N/A  . Years of Education: N/A   Social History Main Topics  . Smoking status: Current Every Day Smoker -- 0.25 packs/day    Types: Cigarettes  . Smokeless tobacco: Never Used  . Alcohol Use: No  . Drug Use: No  . Sexual Activity: Not Asked   Other Topics Concern  . None   Social History Narrative   Review of Systems - See HPI.  All other ROS are negative.  BP 140/80 mmHg  Pulse 78  Temp(Src) 97.8 F (36.6 C) (Oral)  Ht  (1.6 m)  Wt 178 lb 6.4 oz (80.922 kg)  BMI 31.61 kg/m2  SpO2 98%  Physical Exam  Constitutional: She is oriented to person, place, and time and well-developed, well-nourished, and in no  distress.  HENT:  Head: Normocephalic and atraumatic.  Right Ear: Tympanic membrane normal.  Left Ear: Tympanic membrane normal.  Nose: Mucosal edema and rhinorrhea present. Right sinus exhibits maxillary sinus tenderness.  Mouth/Throat: Uvula is midline, oropharynx is clear and moist and mucous membranes are normal.  Eyes: Conjunctivae are normal.  Neck: Neck supple.  Cardiovascular: Normal rate, regular rhythm, normal heart sounds and intact distal pulses.   Pulmonary/Chest: Effort normal and breath sounds normal. No respiratory distress. She has no wheezes. She has no rales. She exhibits no tenderness.  Neurological: She is alert and oriented to person, place, and time.  Skin: Skin is warm and dry. No rash noted.  Psychiatric: Affect normal.     Assessment/Plan: Acute bacterial sinusitis Rx Augmetin.  Increase fluids.  Rest.  Saline nasal spray.  Probiotic.  Mucinex as directed.  Humidifier in bedroom. Delsym for cough.  Call or return to clinic if symptoms are not improving.

## 2015-11-28 MED FILL — LISINOPRIL 20 MG TABLET: 20 | 30 days supply | Qty: 30 | Fill #5

## 2015-12-31 ENCOUNTER — Other Ambulatory Visit: Payer: Self-pay | Admitting: Physician Assistant

## 2015-12-31 MED FILL — LISINOPRIL 20 MG TABLET: 20 | 90 days supply | Qty: 90 | Fill #0

## 2015-12-31 NOTE — Telephone Encounter (Signed)
Medication filled to pharmacy as requested.   

## 2016-03-22 MED FILL — LISINOPRIL 20 MG TABLET: 20 | 90 days supply | Qty: 90 | Fill #1

## 2016-04-03 LAB — LIPID PANEL: LDL Cholesterol: 89 mg/dL

## 2016-06-04 LAB — LIPID PANEL
CHOLESTEROL: 174 mg/dL (ref 0–200)
HDL: 71 mg/dL — AB (ref 35–70)
Triglycerides: 71 mg/dL (ref 40–160)

## 2016-06-04 LAB — HM MAMMOGRAPHY: HM Mammogram: NORMAL (ref 0–4)

## 2016-06-22 ENCOUNTER — Encounter: Payer: Self-pay | Admitting: Physician Assistant

## 2016-06-22 ENCOUNTER — Ambulatory Visit (INDEPENDENT_AMBULATORY_CARE_PROVIDER_SITE_OTHER): Payer: 59 | Admitting: Physician Assistant

## 2016-06-22 VITALS — BP 118/78 | HR 75 | Temp 98.2°F | Resp 16 | Ht 63.0 in | Wt 179.1 lb

## 2016-06-22 DIAGNOSIS — I10 Essential (primary) hypertension: Secondary | ICD-10-CM | POA: Diagnosis not present

## 2016-06-22 LAB — BASIC METABOLIC PANEL
BUN: 11 mg/dL (ref 6–23)
CHLORIDE: 103 meq/L (ref 96–112)
CO2: 29 mEq/L (ref 19–32)
Calcium: 9.6 mg/dL (ref 8.4–10.5)
Creatinine, Ser: 0.68 mg/dL (ref 0.40–1.20)
GFR: 94.5 mL/min (ref >60.00)
GLUCOSE: 105 mg/dL — AB (ref 70–99)
POTASSIUM: 3.8 meq/L (ref 3.5–5.1)
Sodium: 138 mEq/L (ref 135–145)

## 2016-06-22 MED ORDER — LISINOPRIL 20 MG PO TABS: 20.0000 mg | ORAL_TABLET | Freq: Every day | ORAL | 1 refills | Status: DC

## 2016-06-22 MED FILL — LISINOPRIL 20 MG TABLET: 20 | 90 days supply | Qty: 90 | Fill #0

## 2016-06-22 NOTE — Progress Notes (Signed)
   Patient presents to clinic today for follow-up of hypertension. Patient currently on regimen of lisinopril 20 mg daily. Has been well controlled on this regimen for some time. Patient endorses taking medication as directed. Patient denies chest pain, palpitations, lightheadedness, dizziness, vision changes or frequent headaches.  BP Readings from Last 3 Encounters:  06/22/16 118/78  10/29/15 140/80  06/02/15 118/80   Past Medical History:  Diagnosis Date  . ADD (attention deficit disorder)   . Hypertension     Current Outpatient Prescriptions on File Prior to Visit  Medication Sig Dispense Refill  . amphetamine-dextroamphetamine (ADDERALL XR) 30 MG 24 hr capsule Take 4 tablets by mouth daily as needed.    . valACYclovir (VALTREX) 1000 MG tablet Take 2 tablets (2,000 mg total) by mouth 2 (two) times daily as needed. For 1 day. 20 tablet 0   No current facility-administered medications on file prior to visit.     No Known Allergies  Family History  Problem Relation Age of Onset  . Alcoholism Mother [id number]    Deceased  . Other Father     Deceased- 5970-80s  . Stroke Maternal Grandmother   . Dementia Paternal Aunt   . Healthy Son     x1    Social History   Social History  . Marital status: Married    Spouse name: N/A  . Number of children: N/A  . Years of education: N/A   Social History Main Topics  . Smoking status: Current Every Day Smoker    Packs/day: 0.25    Types: Cigarettes  . Smokeless tobacco: Never Used  . Alcohol use No  . Drug use: No  . Sexual activity: Not Asked   Other Topics Concern  . None   Social History Narrative  . None   Review of Systems - See HPI.  All other ROS are negative.  BP 118/78 (BP Location: Right Arm, Patient Position: Sitting, Cuff Size: Large)   Pulse 75   Temp 98.2 F (36.8 C) (Oral)   Resp 16   Ht 5\' 3"  (1.6 m)   Wt 179 lb 2 oz (81.3 kg)   SpO2 99%   BMI 31.73 kg/m   Physical Exam  Constitutional: She is oriented  to person, place, and time and well-developed, well-nourished, and in no distress.  HENT:  Head: Normocephalic and atraumatic.  Eyes: Conjunctivae are normal.  Neck: Neck supple.  Cardiovascular: Normal rate, regular rhythm, normal heart sounds and intact distal pulses.   Pulmonary/Chest: Effort normal and breath sounds normal. No respiratory distress. She has no wheezes. She has no rales. She exhibits no tenderness.  Neurological: She is alert and oriented to person, place, and time.  Skin: Skin is warm and dry. No rash noted.  Psychiatric: Affect normal.  Vitals reviewed.  Assessment/Plan: Essential hypertension, benign BP normotensive. Asymptomatic. Will check BMP today. Medications refilled. Recent results from Biometric screen look good. Will abstract into her chart.  Piedad ClimesMartin, Therisa Mennella Cody, PA-C

## 2016-06-22 NOTE — Progress Notes (Signed)
Pre visit review using our clinic review tool, if applicable. No additional management support is needed unless otherwise documented below in the visit note/SLS  

## 2016-06-22 NOTE — Assessment & Plan Note (Signed)
BP normotensive. Asymptomatic. Will check BMP today. Medications refilled. Recent results from Biometric screen look good. Will abstract into her chart.

## 2016-06-22 NOTE — Patient Instructions (Signed)
Please go to the lab for blood work. I will call with your results.  Please continue medications as directed. Check with your insurance regarding the Cologuard testing.   Follow-up in 6 months.  DASH Eating Plan DASH stands for "Dietary Approaches to Stop Hypertension." The DASH eating plan is a healthy eating plan that has been shown to reduce high blood pressure (hypertension). Additional health benefits may include reducing the risk of type 2 diabetes mellitus, heart disease, and stroke. The DASH eating plan may also help with weight loss. WHAT DO I NEED TO KNOW ABOUT THE DASH EATING PLAN? For the DASH eating plan, you will follow these general guidelines:  Choose foods with a percent daily value for sodium of less than 5% (as listed on the food label).  Use salt-free seasonings or herbs instead of table salt or sea salt.  Check with your health care provider or pharmacist before using salt substitutes.  Eat lower-sodium products, often labeled as "lower sodium" or "no salt added."  Eat fresh foods.  Eat more vegetables, fruits, and low-fat dairy products.  Choose whole grains. Look for the word "whole" as the first word in the ingredient list.  Choose fish and skinless chicken or [place] more often than red meat. Limit fish, poultry, and meat to 6 oz (170 g) each day.  Limit sweets, desserts, sugars, and sugary drinks.  Choose heart-healthy fats.  Limit cheese to 1 oz (28 g) per day.  Eat more home-cooked food and less restaurant, buffet, and fast food.  Limit fried foods.  Cook foods using methods other than frying.  Limit canned vegetables. If you do use them, rinse them well to decrease the sodium.  When eating at a restaurant, ask that your food be prepared with less salt, or no salt if possible. WHAT FOODS CAN I EAT? Seek help from a dietitian for individual calorie needs. Grains Whole grain or whole wheat bread. Brown rice. Whole grain or whole wheat pasta.  Quinoa, bulgur, and whole grain cereals. Low-sodium cereals. Corn or whole wheat flour tortillas. Whole grain cornbread. Whole grain crackers. Low-sodium crackers. Vegetables Fresh or frozen vegetables (raw, steamed, roasted, or grilled). Low-sodium or reduced-sodium tomato and vegetable juices. Low-sodium or reduced-sodium tomato sauce and paste. Low-sodium or reduced-sodium canned vegetables.  Fruits All fresh, canned (in natural juice), or frozen fruits. Meat and Other Protein Products Ground beef (85% or leaner), grass-fed beef, or beef trimmed of fat. Skinless chicken or [place]. Ground chicken or [place]. Pork trimmed of fat. All fish and seafood. Eggs. Dried beans, peas, or lentils. Unsalted nuts and seeds. Unsalted canned beans. Dairy Low-fat dairy products, such as skim or 1% milk, 2% or reduced-fat cheeses, low-fat ricotta or cottage cheese, or plain low-fat yogurt. Low-sodium or reduced-sodium cheeses. Fats and Oils Tub margarines without trans fats. Light or reduced-fat mayonnaise and salad dressings (reduced sodium). Avocado. Safflower, olive, or canola oils. Natural peanut or almond butter. Other Unsalted popcorn and pretzels. The items listed above may not be a complete list of recommended foods or beverages. Contact your dietitian for more options. WHAT FOODS ARE NOT RECOMMENDED? Grains White bread. White pasta. White rice. Refined cornbread. Bagels and croissants. Crackers that contain trans fat. Vegetables Creamed or fried vegetables. Vegetables in a cheese sauce. Regular canned vegetables. Regular canned tomato sauce and paste. Regular tomato and vegetable juices. Fruits Dried fruits. Canned fruit in light or heavy syrup. Fruit juice. Meat and Other Protein Products Fatty cuts of meat. Ribs, chicken wings, bacon,  sausage, bologna, salami, chitterlings, fatback, hot dogs, bratwurst, and packaged luncheon meats. Salted nuts and seeds. Canned beans with salt. Dairy Whole or 2%  milk, cream, half-and-half, and cream cheese. Whole-fat or sweetened yogurt. Full-fat cheeses or blue cheese. Nondairy creamers and whipped toppings. Processed cheese, cheese spreads, or cheese curds. Condiments Onion and garlic salt, seasoned salt, table salt, and sea salt. Canned and packaged gravies. Worcestershire sauce. Tartar sauce. Barbecue sauce. Teriyaki sauce. Soy sauce, including reduced sodium. Steak sauce. Fish sauce. Oyster sauce. Cocktail sauce. Horseradish. Ketchup and mustard. Meat flavorings and tenderizers. Bouillon cubes. Hot sauce. Tabasco sauce. Marinades. Taco seasonings. Relishes. Fats and Oils Butter, stick margarine, lard, shortening, ghee, and bacon fat. Coconut, palm kernel, or palm oils. Regular salad dressings. Other Pickles and olives. Salted popcorn and pretzels. The items listed above may not be a complete list of foods and beverages to avoid. Contact your dietitian for more information. WHERE CAN I FIND MORE INFORMATION? National Heart, Lung, and Blood Institute: travelstabloid.com   This information is not intended to replace advice given to you by your health care provider. Make sure you discuss any questions you have with your health care provider.   Document Released: 08/19/2011 Document Revised: 09/20/2014 Document Reviewed: 07/04/2013 Elsevier Interactive Patient Education [employer].

## 2016-08-31 MED FILL — LISINOPRIL 20 MG TABLET: 20 | 90 days supply | Qty: 90 | Fill #1

## 2016-09-15 ENCOUNTER — Ambulatory Visit (INDEPENDENT_AMBULATORY_CARE_PROVIDER_SITE_OTHER): Payer: 59 | Admitting: Family Medicine

## 2016-09-15 ENCOUNTER — Encounter: Payer: Self-pay | Admitting: Family Medicine

## 2016-09-15 VITALS — BP 112/78 | HR 96 | Temp 98.8°F | Ht 63.0 in | Wt 179.8 lb

## 2016-09-15 DIAGNOSIS — B9689 Other specified bacterial agents as the cause of diseases classified elsewhere: Secondary | ICD-10-CM

## 2016-09-15 DIAGNOSIS — J208 Acute bronchitis due to other specified organisms: Secondary | ICD-10-CM

## 2016-09-15 MED ORDER — BENZONATATE 100 MG PO CAPS: 100.0000 mg | ORAL_CAPSULE | Freq: Three times a day (TID) | ORAL | 0 refills | Status: AC | PRN

## 2016-09-15 MED ORDER — AZITHROMYCIN 250 MG PO TABS: ORAL_TABLET | ORAL | 0 refills | Status: DC

## 2016-09-15 MED FILL — BENZONATATE 100 MG CAPSULE: 100 | 6 days supply | Qty: 20 | Fill #0

## 2016-09-15 MED FILL — AZITHROMYCIN 250 MG TABLET: 250 | 5 days supply | Qty: 6 | Fill #0

## 2016-09-15 NOTE — Patient Instructions (Signed)
Claritin (loratadine), Allegra (fexofenadine), Zyrtec (cetirizine); these are listed in order from weakest to strongest. Generic, and therefore cheaper, options are in the parentheses.   Flonase (fluticasone); nasal spray that is over the counter. 2 sprays each nostril, once daily. Aim towards the same side eye when you spray.  There are available OTC, and the generic versions, which may be cheaper, are in parentheses. Show this to a pharmacist if you have trouble finding any of these items.  

## 2016-09-15 NOTE — Progress Notes (Signed)
Pre visit review using our clinic review tool, if applicable. No additional management support is needed unless otherwise documented below in the visit note. 

## 2016-09-15 NOTE — Progress Notes (Signed)
Chief Complaint  Patient presents with  . Nasal Congestion    Pt reports ear pain nasal and chest congestion     Chelsea Lang here for URI complaints.  Duration: 10 days, worsening  Associated symptoms: fevers (up to 101 F) sinus congestion, rhinorrhea, ear pain, sore throat, cough and chest tightness Denies: Itchy, watery eyes, ear drainage/bleeding, sinus pain, myalgias Treatment to date: Mucinex, OTC remedies Sick contacts: Yes  ROS:  Const: + fevers HEENT: As noted in HPI Lungs: No SOB  Past Medical History:  Diagnosis Date  . ADD (attention deficit disorder)   . Hypertension    Family History  Problem Relation Age of Onset  . Alcoholism Mother 349    Deceased  . Other Father     Deceased- 5970-80s  . Stroke Maternal Grandmother   . Dementia Paternal Aunt   . Healthy Son     x1    BP 112/78 (BP Location: Left Arm, Patient Position: Sitting, Cuff Size: Large)   Pulse 96   Temp 98.8 F (37.1 C) (Oral)   Ht 5\' 3"  (1.6 m)   Wt 179 lb 12.8 oz (81.6 kg)   SpO2 91%   BMI 31.85 kg/m  General: Awake, alert, appears stated age HEENT: AT, , ears patent b/l and TM's neg, nares paten + clear discharge, pharynx pink and without exudates, MMM Neck: No masses or asymmetry Heart: RRR, no murmurs, no bruits Lung+transmitted upper airway noises, CTAB otherwise, no accessory muscle use Psych: Age appropriate judgment and insight, normal mood and affect  Acute bacterial bronchitis - Plan: azithromycin (ZITHROMAX) 250 MG tablet, benzonatate (TESSALON) 100 MG capsule  Orders as above. PO antihistamines and Flonase recommended for further symptom control.  Wash hands, cover mouth when coughing, push fluids. F/u in 1 week if symptoms worsen or fail to improve. Pt voiced understanding and agreement to the plan.  Jilda Rocheicholas Paul CarrickWendling, DO 09/15/16 8:39 AM

## 2016-09-22 ENCOUNTER — Encounter: Payer: Self-pay | Admitting: Family Medicine

## 2016-09-22 ENCOUNTER — Ambulatory Visit (INDEPENDENT_AMBULATORY_CARE_PROVIDER_SITE_OTHER): Payer: 59 | Admitting: Family Medicine

## 2016-09-22 VITALS — BP 128/66 | HR 84 | Temp 97.8°F | Wt 179.6 lb

## 2016-09-22 DIAGNOSIS — J029 Acute pharyngitis, unspecified: Secondary | ICD-10-CM | POA: Diagnosis not present

## 2016-09-22 DIAGNOSIS — J0100 Acute maxillary sinusitis, unspecified: Secondary | ICD-10-CM

## 2016-09-22 LAB — POCT RAPID STREP A (OFFICE): RAPID STREP A SCREEN: NEGATIVE

## 2016-09-22 MED ORDER — AMOXICILLIN-POT CLAVULANATE 875-125 MG PO TABS: 1.0000 | ORAL_TABLET | Freq: Two times a day (BID) | ORAL | 0 refills | Status: DC

## 2016-09-22 MED FILL — AMOX-CLAV 875-125 MG TABLET: 875-125 | 10 days supply | Qty: 20 | Fill #0

## 2016-09-22 NOTE — Progress Notes (Signed)
Chief Complaint  Patient presents with  . Sore Throat    Chelsea Lang here for URI complaints.  Duration: 17 days  Associated symptoms: sinus congestion, sinus pain, rhinorrhea, sore throat and cough Denies: subjective fever, sinus headache, ear pain, ear drainage, shortness of breath and myalgia Treatment to date: Zpak, Mucinex, salt water gargles Sick contacts: No  ROS:  Const: Denies fevers HEENT: As noted in HPI Lungs: No SOB  Past Medical History:  Diagnosis Date  . ADD (attention deficit disorder)   . Hypertension    Family History  Problem Relation Age of Onset  . Alcoholism Mother 7649    Deceased  . Other Father     Deceased- 4370-80s  . Stroke Maternal Grandmother   . Dementia Paternal Aunt   . Healthy Son     x1    BP 128/66 (BP Location: Right Arm, Patient Position: Sitting, Cuff Size: Normal)   Pulse 84   Temp 97.8 F (36.6 C) (Oral)   Wt 179 lb 9.6 oz (81.5 kg)   SpO2 100% Comment: RA  BMI 31.81 kg/m  General: Awake, alert, appears stated age HEENT: AT, Chelsea Lang, ears patent b/l and TM's neg, nares patent w/o discharge, max sinsuses TTP b/l, pharynx pink and without exudates, MMM Neck: No masses or asymmetry Heart: RRR, no murmurs, no bruits Lungs: CTAB, no accessory muscle use Psych: Age appropriate judgment and insight, normal mood and affect  Acute non-recurrent maxillary sinusitis - Plan: amoxicillin-clavulanate (AUGMENTIN) 875-125 MG tablet  Sore throat - Plan: POCT rapid strep A  Orders as above. Stay on Zyrtec and Flonase. Continue to push fluids, practice good hand hygiene, cover mouth when coughing. F/u in 1 week if symptoms worsen or fail to improve. Pt voiced understanding and agreement to the plan.  Jilda Rocheicholas Paul [place], DO 09/22/16 11:42 AM

## 2016-09-22 NOTE — Progress Notes (Signed)
Pre visit review using our clinic review tool, if applicable. No additional management support is needed unless otherwise documented below in the visit note. 

## 2016-09-22 NOTE — Patient Instructions (Signed)
Keep using Flonase and Zyrtec.   Keep practicing good hand hygiene, drinking lots of fluids, and covering mouth.  Stop smoking.

## 2016-10-14 ENCOUNTER — Encounter (INDEPENDENT_AMBULATORY_CARE_PROVIDER_SITE_OTHER): Payer: Self-pay | Admitting: Physician Assistant

## 2016-10-14 ENCOUNTER — Ambulatory Visit (INDEPENDENT_AMBULATORY_CARE_PROVIDER_SITE_OTHER): Payer: 59

## 2016-10-14 ENCOUNTER — Ambulatory Visit (INDEPENDENT_AMBULATORY_CARE_PROVIDER_SITE_OTHER): Payer: 59 | Admitting: Physician Assistant

## 2016-10-14 DIAGNOSIS — M25521 Pain in right elbow: Secondary | ICD-10-CM

## 2016-10-14 DIAGNOSIS — M7021 Olecranon bursitis, right elbow: Secondary | ICD-10-CM

## 2016-10-14 NOTE — Progress Notes (Signed)
Office Visit Note   Patient: Hassel NethKim Akerson           Date of Birth: 05/15/1956           MRN: [id number] Visit Date: 10/14/2016              Requested by: Waldon MerlWilliam C Martin, PA-C [address] [place], [place] [address] PCP: Piedad ClimesMartin, William Cody, PA-C   Assessment & Plan: Visit Diagnoses:  1. Pain in right elbow   2. Olecranon bursitis, right elbow     Plan: Back in 2 weeks' check the right elbow. Compression bandage was applied today she'll leave this on until this evening and then remove it. She was reminded to keep pressure off the elbow as much as possible.  Follow-Up Instructions: Return in about 2 weeks (around 10/28/2016).   Orders:  Orders Placed This Encounter  Procedures  . XR Elbow 2 Views Right   No orders of the defined types were placed in this encounter.     Procedures: No procedures performed   Clinical Data: No additional findings.   Subjective: Chief Complaint  Patient presents with  . Right Elbow - Pain    HPI This Wery's known Dr. Eliberto IvoryBlackman's service comes in today with right elbow pain. She reports that she fell on her elbow during the summer. Now she has area of swelling circle on the right elbow. Only hurts when she is applying pressure to the right elbow. Review of Systems   Objective: Vital Signs: There were no vitals taken for this visit.  Physical Exam  Constitutional: She is oriented to person, place, and time. She appears well-developed and well-nourished. No distress.  Neurological: She is alert and oriented to person, place, and time.  Psychiatric: She has a normal mood and affect.    Ortho Exam Right hand sensation intact for motor. Radial pulse is 2+ She has full supination pronation the right forearm and full extension flexion of the elbow. Swelling over the posterior aspect of the elbow olecranon region consistent with olecranon bursitis. No abnormal warmth or gross signs of infection Specialty Comments:  No specialty  comments available.  Imaging: Xr Elbow 2 Views Right  Result Date: 10/14/2016 AP lateral views of the right elbow: Shows no acute fracture. The elbow is well located. No arthritic changes noted throughout the elbow. Soft tissue swelling posterior to the olecranon present. No evidence of foreign body    PMFS History: Patient Active Problem List   Diagnosis Date Noted  . Essential hypertension, benign 05/28/2014  . ADD (attention deficit disorder) 05/28/2014  . Oral herpes simplex infection 05/28/2014   Past Medical History:  Diagnosis Date  . ADD (attention deficit disorder)   . Hypertension     Family History  Problem Relation Age of Onset  . Alcoholism Mother 7349    Deceased  . Other Father     Deceased- 8670-80s  . Stroke Maternal Grandmother   . Dementia Paternal Aunt   . Healthy Son     x1    Past Surgical History:  Procedure Laterality Date  . ABDOMINAL HYSTERECTOMY    . Arm surgery  2009   Broken  . LEG SURGERY  2011   Broken  . TONSILLECTOMY    . WISDOM TOOTH EXTRACTION     Social History   Occupational History  . Not on file.   Social History Main Topics  . Smoking status: Current Every Day Smoker    Packs/day: 0.25  Types: Cigarettes  . Smokeless tobacco: Never Used  . Alcohol use No  . Drug use: No  . Sexual activity: Not on file

## 2016-11-01 ENCOUNTER — Ambulatory Visit (INDEPENDENT_AMBULATORY_CARE_PROVIDER_SITE_OTHER): Payer: 59 | Admitting: Physician Assistant

## 2016-11-01 DIAGNOSIS — M7021 Olecranon bursitis, right elbow: Secondary | ICD-10-CM | POA: Diagnosis not present

## 2016-11-01 NOTE — Progress Notes (Signed)
   Office Visit Note   Patient: Chelsea Lang           Date of Birth: 08-01-1956           MRN: [id number] Visit Date: 11/01/2016              Requested by: Chelsea MerlWilliam C Martin, PA-C [address] [place], [place] [address] PCP: Piedad ClimesMartin, Chelsea Cody, PA-C   Assessment & Plan: Visit Diagnoses: No diagnosis found.  Plan: Massage over the elbow to break up scar tissue and desensitize the area. Keep pressure of the elbow as much as possible.  Follow-Up Instructions: Return if symptoms worsen or fail to improve.   Orders:  No orders of the defined types were placed in this encounter.  No orders of the defined types were placed in this encounter.     Procedures: No procedures performed   Clinical Data: No additional findings.   Subjective: Chief Complaint  Patient presents with  . Right Elbow - Follow-up    [name] is here for follow up on her right elbow. She states that it is better, says that she feels something move in her elbow.    Review of Systems   Objective: Vital Signs: There were no vitals taken for this visit.  Physical Exam  Ortho Exam No sign of recurrence of the olecranon bursitis. Some thickening of tissue is palpable. No signs of infection. No drainage. No edema. Specialty Comments:  No specialty comments available.  Imaging: No results found.   PMFS History: Patient Active Problem List   Diagnosis Date Noted  . Essential hypertension, benign 05/28/2014  . ADD (attention deficit disorder) 05/28/2014  . Oral herpes simplex infection 05/28/2014   Past Medical History:  Diagnosis Date  . ADD (attention deficit disorder)   . Hypertension     Family History  Problem Relation Age of Onset  . Alcoholism Mother 4749    Deceased  . Other Father     Deceased- 4870-80s  . Stroke Maternal Grandmother   . Dementia Paternal Aunt   . Healthy Son     x1    Past Surgical History:  Procedure Laterality Date  . ABDOMINAL HYSTERECTOMY    . Arm  surgery  2009   Broken  . LEG SURGERY  2011   Broken  . TONSILLECTOMY    . WISDOM TOOTH EXTRACTION     Social History   Occupational History  . Not on file.   Social History Main Topics  . Smoking status: Current Every Day Smoker    Packs/day: 0.25    Types: Cigarettes  . Smokeless tobacco: Never Used  . Alcohol use No  . Drug use: No  . Sexual activity: Not on file

## 2016-12-13 ENCOUNTER — Other Ambulatory Visit: Payer: Self-pay | Admitting: Physician Assistant

## 2016-12-14 MED FILL — LISINOPRIL 20 MG TABLET: 20 | 90 days supply | Qty: 90 | Fill #0

## 2017-03-23 ENCOUNTER — Telehealth: Payer: Self-pay | Admitting: *Deleted

## 2017-03-23 MED ORDER — LISINOPRIL 20 MG PO TABS: 20.0000 mg | ORAL_TABLET | Freq: Every day | ORAL | 0 refills | Status: DC

## 2017-03-23 MED FILL — LISINOPRIL 20 MG TABLET: 20 | 30 days supply | Qty: 30 | Fill #0

## 2017-03-23 NOTE — Telephone Encounter (Signed)
Rx approved but only given #30.   Sent to the pharmacy by e-script.  Pt need an office visit.//AB/CMA

## 2017-04-25 ENCOUNTER — Other Ambulatory Visit: Payer: Self-pay | Admitting: Family Medicine

## 2017-04-25 MED FILL — LISINOPRIL 20 MG TABS: 20 | 15 days supply | Qty: 15 | Fill #0

## 2017-04-25 NOTE — Telephone Encounter (Signed)
Rx approved and sent to the pharmacy by e-script.  Pt will need a office visit before further refill.//AB/CMA

## 2017-04-26 ENCOUNTER — Telehealth: Payer: Self-pay | Admitting: Medical

## 2017-04-26 ENCOUNTER — Telehealth: Payer: Self-pay | Admitting: Physician Assistant

## 2017-04-26 NOTE — Telephone Encounter (Signed)
[facility] - Client TELEPHONE ADVICE RECORD [facility] Patient Name: Chelsea Lang DOB: 06/16/1956 Initial Comment Caller having dizziness. Nurse Assessment Nurse: Lane HackerHarley, RN, Elvin SoWindy Date/Time (Eastern Time): 04/26/2017 9:48:31 AM Confirm and document reason for call. If symptomatic, describe symptoms. ---Caller states that she is having vertigo. Really bad on Saturday. Spent the morning in bed. Feels r/t left ear pressure. - ENT couldn't work her in before 05/14/17. Does the patient have any new or worsening symptoms? ---Yes Will a triage be completed? ---Yes Related visit to physician within the last 2 weeks? ---No Does the PT have any chronic conditions? (i.e. diabetes, asthma, etc.) ---Yes List chronic conditions. ---HTN Is this a behavioral health or substance abuse call? ---No Guidelines Guideline Title Affirmed Question Affirmed Notes Ear - Congestion Ear congestion present > 48 hours Final Disposition User See PCP When Office is Open (within 3 days) Lane HackerHarley, RN, Elvin SoWindy Comments Appt made with Dr. Esperanza RichtersEdward Saguier since [place] Martin no longer is at this office. Pt aware. Disagree/Comply: Comply

## 2017-04-26 NOTE — Telephone Encounter (Signed)
Pt called in to schedule an apt. She said that she is experiencing some dizziness. Pt says that it is due to a ear ache but still transferred pt to [facility] for triaging.

## 2017-04-27 ENCOUNTER — Ambulatory Visit: Payer: Self-pay | Admitting: Medical

## 2017-04-27 DIAGNOSIS — Z0289 Encounter for other administrative examinations: Secondary | ICD-10-CM

## 2017-04-27 MED FILL — AMOXICILLIN 875 MG TABLET: 875 | 10 days supply | Qty: 20 | Fill #0

## 2017-04-27 MED FILL — FLUTICASONE PROP 50 MCG SPR: 50 | 30 days supply | Qty: 16 | Fill #0

## 2017-04-29 NOTE — Telephone Encounter (Signed)
Received note from [facility] today regarding patient. Left message on answering machine regarding scheduling appointment for symptoms.

## 2017-05-11 ENCOUNTER — Telehealth: Payer: Self-pay | Admitting: Emergency Medicine

## 2017-05-11 NOTE — Telephone Encounter (Signed)
LMOVM advising patient she is due for CPE. [facility]

## 2017-11-24 ENCOUNTER — Ambulatory Visit (INDEPENDENT_AMBULATORY_CARE_PROVIDER_SITE_OTHER): Payer: 59 | Admitting: Physician Assistant

## 2017-11-24 ENCOUNTER — Encounter (INDEPENDENT_AMBULATORY_CARE_PROVIDER_SITE_OTHER): Payer: Self-pay | Admitting: Physician Assistant

## 2017-11-24 ENCOUNTER — Ambulatory Visit (INDEPENDENT_AMBULATORY_CARE_PROVIDER_SITE_OTHER): Payer: 59

## 2017-11-24 DIAGNOSIS — M25561 Pain in right knee: Secondary | ICD-10-CM

## 2017-11-24 MED ORDER — METHYLPREDNISOLONE ACETATE 40 MG/ML IJ SUSP
40.0000 mg | INTRAMUSCULAR | Status: AC | PRN
  Administered 2017-11-24: 40 mg via INTRA_ARTICULAR

## 2017-11-24 MED ORDER — LIDOCAINE HCL 1 % IJ SOLN
3.0000 mL | INTRAMUSCULAR | Status: AC | PRN
  Administered 2017-11-24: 3 mL

## 2017-11-24 NOTE — Progress Notes (Addendum)
Office Visit Note   Patient: Chelsea Lang           Date of Birth: 11/20/1955           MRN: [id number] Visit Date: 11/24/2017              Requested by: Waldon MerlMartin, William C, PA-C [address] [place], [place] [address] PCP: Waldon MerlMartin, William C, PA-C   Assessment & Plan: Visit Diagnoses:  1. Acute pain of right knee     Plan: She will work on Dance movement psychotherapistquad strengthening.  She did have some relief of pain after the cortisone injection.  Recommended she go home and ice the knee for 15-20 minutes.  She will be mindful of any mechanical symptoms of the knee that are reviewed with her today.  Follow-up in 2 weeks check her progress lack of.  She continues to have pain weakness and particularly painful popping of the knee than most likely will order MRI to rule out meniscal tear.  Questions were encouraged and answered at length  Follow-Up Instructions: Return in about 2 weeks (around 12/08/2017).   Orders:  Orders Placed This Encounter  Procedures  . Large Joint Inj: R knee  . XR Knee 1-2 Views Right   Meds ordered this encounter  Medications  . lidocaine (XYLOCAINE) 1 % (with pres) injection 3 mL  . methylPREDNISolone acetate (DEPO-MEDROL) injection 40 mg      Procedures: Large Joint Inj: R knee on 11/24/2017 1:54 PM Indications: pain Details: 22 G 1.5 in needle, anterolateral approach  Arthrogram: No  Medications: 3 mL lidocaine 1 %; 40 mg methylPREDNISolone acetate 40 MG/ML Outcome: tolerated well, no immediate complications Procedure, treatment alternatives, risks and benefits explained, specific risks discussed. Consent was given by the patient. Immediately prior to procedure a time out was called to verify the correct patient, procedure, equipment, support staff and site/side marked as required. Patient was prepped and draped in the usual sterile fashion.       Clinical Data: No additional findings.   Subjective: Chief Complaint  Patient presents with  . Right Knee - Pain     HPI Chelsea Lang is someone we have not seen for a while she returns today with a new problem of right knee pain.  She reports no injury to the knee.  Pain is been ongoing for the past 2 weeks.  She states her pain is worse when she first begins to ambulate after sitting or getting up from a  lying down position.  Mechanical symptoms positive for painful popping.  She states the knee feels weak but is not actually giving way.  She has shooting pain down to the ankle at times.  Most of her pain is in the popliteal region of the knee.  She had no shortness of breath chest pain calf pain.  Pain in the knee is described as a sharp stabbing pain.  She is tried icy hot no other medications.  She is not using any type of assistive device to ambulate.  Review of Systems Denies chest pain fevers chills shortness breath nausea vomiting  Objective: Vital Signs: There were no vitals taken for this visit.  Physical Exam  Constitutional: She is oriented to person, place, and time. She appears well-developed and well-nourished. No distress.  Pulmonary/Chest: Effort normal.  Neurological: She is alert and oriented to person, place, and time.  Skin: She is not diaphoretic.  Psychiatric: She has a normal mood and affect.  Ortho Exam Bilateral knees no effusion abnormal warmth erythema.  No rashes skin lesions ulcerations about either knee.  Calves are supple and nontender bilaterally.  She has no instability valgus varus stressing of either knee.  She has full extension of both knees actively.  Active flexion of left knee is full right knee is to 90 degrees she has pain passively I can bring her to approximately 110 degrees of flexion on the right but this causes her discomfort.  She has tenderness along the medial lateral joint line of the right knee.  Left knee is nontender throughout.  Anterior drawer is negative bilaterally.  McMurray's negative bilaterally.  Specialty Comments:  No specialty comments  available.  Imaging: No results found.   PMFS History: Patient Active Problem List   Diagnosis Date Noted  . Essential hypertension, benign 05/28/2014  . ADD (attention deficit disorder) 05/28/2014  . Oral herpes simplex infection 05/28/2014   Past Medical History:  Diagnosis Date  . ADD (attention deficit disorder)   . Hypertension     Family History  Problem Relation Age of Onset  . Alcoholism Mother 73       Deceased  . Other Father        Deceased- 78-80s  . Stroke Maternal Grandmother   . Dementia Paternal Aunt   . Healthy Son        x1    Past Surgical History:  Procedure Laterality Date  . ABDOMINAL HYSTERECTOMY    . Arm surgery  2009   Broken  . LEG SURGERY  2011   Broken  . TONSILLECTOMY    . WISDOM TOOTH EXTRACTION     Social History   Occupational History  . Not on file  Tobacco Use  . Smoking status: Current Every Day Smoker    Packs/day: 0.25    Types: Cigarettes  . Smokeless tobacco: Never Used  Substance and Sexual Activity  . Alcohol use: No    Alcohol/week: 0.0 oz  . Drug use: No  . Sexual activity: Not on file

## 2017-12-08 ENCOUNTER — Encounter (INDEPENDENT_AMBULATORY_CARE_PROVIDER_SITE_OTHER): Payer: Self-pay | Admitting: Physician Assistant

## 2017-12-08 ENCOUNTER — Ambulatory Visit (INDEPENDENT_AMBULATORY_CARE_PROVIDER_SITE_OTHER): Payer: 59 | Admitting: Physician Assistant

## 2017-12-08 DIAGNOSIS — M25561 Pain in right knee: Secondary | ICD-10-CM

## 2017-12-08 NOTE — Progress Notes (Addendum)
HPI: Ms. Melvyn NethLewis returns today follow-up of her right knee status post cortisone injection 11/24/2017.  She states it helps some.  She states she is overall doing better she is able to get up and ambulate without warning about the knee giving way.  She is having no mechanical symptoms in the knee.  Again she has some mild degenerative changes on x-ray.  Not taking any anti-inflammatories.  She is performing quad strengthening exercises shown.  States that the knee just is not 100% but is overall much improved.  Review of systems: See HPI otherwise negative  Physical exam: Right knee she has full extension full flexion mild patellofemoral crepitus.  No tenderness along medial lateral joint line.  No instability valgus varus stressing.  No effusion abnormal warmth erythema.  Impression: Early arthritic changes along the medial and patellofemoral compartments  Plan: She does not want to take over-the-counter anti-inflammatory daily therefore discussed with her taking for moderate pain.  Also gave her a handout on Monovisc try to gain approval for the Monovisc and then have her back once this is approved.  She will continue to work on Dance movement psychotherapistquad strengthening.

## 2017-12-09 ENCOUNTER — Telehealth (INDEPENDENT_AMBULATORY_CARE_PROVIDER_SITE_OTHER): Payer: Self-pay

## 2017-12-09 NOTE — Telephone Encounter (Signed)
Submitted application online for Monovisc, right knee. 

## 2018-01-09 ENCOUNTER — Telehealth (INDEPENDENT_AMBULATORY_CARE_PROVIDER_SITE_OTHER): Payer: Self-pay

## 2018-01-09 NOTE — Telephone Encounter (Signed)
Submitted application online for SynviscOne injection, right knee. 

## 2018-01-31 ENCOUNTER — Telehealth (INDEPENDENT_AMBULATORY_CARE_PROVIDER_SITE_OTHER): Payer: Self-pay

## 2018-01-31 ENCOUNTER — Telehealth (INDEPENDENT_AMBULATORY_CARE_PROVIDER_SITE_OTHER): Payer: Self-pay | Admitting: Orthopaedic Surgery

## 2018-01-31 NOTE — Telephone Encounter (Signed)
Patient called asking about the approval for her knee injection, she states that she will be changing insurances as of this Saturday and has a few questions for you in regards to that. CB # [phone] Can leave voicemail.

## 2018-01-31 NOTE — Telephone Encounter (Signed)
Talked with patient concerning SynvsicOne injection, right knee.  Covered at 100% after $50.00 copay for (J7325)SynviscOne Covered at 70% for (20610)injection after deductible. Buy & Bill  Appt.scheduled for 02/02/18.

## 2018-01-31 NOTE — Telephone Encounter (Signed)
Called and left VM for patient to return my call to discuss SynviscOne injection, right knee and to schedule an appt.

## 2018-01-31 NOTE — Telephone Encounter (Signed)
error 

## 2018-02-02 ENCOUNTER — Ambulatory Visit (INDEPENDENT_AMBULATORY_CARE_PROVIDER_SITE_OTHER): Payer: 59 | Admitting: Physician Assistant

## 2018-02-02 ENCOUNTER — Encounter (INDEPENDENT_AMBULATORY_CARE_PROVIDER_SITE_OTHER): Payer: Self-pay | Admitting: Physician Assistant

## 2018-02-02 DIAGNOSIS — M1711 Unilateral primary osteoarthritis, right knee: Secondary | ICD-10-CM | POA: Diagnosis not present

## 2018-02-02 MED ORDER — HYLAN G-F 20 48 MG/6ML IX SOSY
48.0000 mg | PREFILLED_SYRINGE | INTRA_ARTICULAR | Status: AC | PRN
  Administered 2018-02-02: 48 mg via INTRA_ARTICULAR

## 2018-02-02 MED ORDER — METHYLPREDNISOLONE ACETATE 40 MG/ML IJ SUSP
40.0000 mg | INTRAMUSCULAR | Status: AC | PRN
  Administered 2018-02-02: 40 mg via INTRA_ARTICULAR

## 2018-02-02 MED ORDER — LIDOCAINE HCL 1 % IJ SOLN
5.0000 mL | INTRAMUSCULAR | Status: AC | PRN
  Administered 2018-02-02: 5 mL

## 2018-02-02 NOTE — Progress Notes (Signed)
   Procedure Note  Patient: Chelsea Lang             Date of Birth: Apr 27, 1956           MRN: [id number]             Visit Date: 02/02/2018   HPI Ms. Encinas comes in today for Synvisc 1 injection right knee.  States the knee overall is better but still has some achy pain at times.  She states is not 100%.  Physical exam: Right knee good range of motion.  Slight effusion no abnormal warmth erythema.  Procedures: Visit Diagnoses: Primary osteoarthritis of right knee  Large Joint Inj on 02/02/2018 3:33 PM Indications: pain Details: 22 G 1.5 in needle, superolateral approach  Arthrogram: No  Medications: 40 mg methylPREDNISolone acetate 40 MG/ML; 5 mL lidocaine 1 %; 48 mg Hylan 48 MG/6ML Aspirate: 10 mL yellow Outcome: tolerated well, no immediate complications Procedure, treatment alternatives, risks and benefits explained, specific risks discussed. Consent was given by the patient. Immediately prior to procedure a time out was called to verify the correct patient, procedure, equipment, support staff and site/side marked as required. Patient was prepped and draped in the usual sterile fashion.    Plan: She will continue to work on quad strengthening.  She will follow-up with [place] in 8 weeks to check her response to the Synvisc 1 injection.  She will follow-up sooner if she has any increasing pain or mechanical symptoms.  Questions encouraged and answered

## 2018-04-10 ENCOUNTER — Ambulatory Visit (INDEPENDENT_AMBULATORY_CARE_PROVIDER_SITE_OTHER): Payer: 59 | Admitting: Physician Assistant
